# Patient Record
Sex: Female | Born: 1975 | Race: White | Hispanic: No | Marital: Married | Smoking: Never smoker
Health system: Southern US, Community
[De-identification: ages and names within clinical notes are randomized; demographics above are authoritative.]

## PROBLEM LIST (undated history)

## (undated) DIAGNOSIS — T7840XA Allergy, unspecified, initial encounter: Secondary | ICD-10-CM

## (undated) DIAGNOSIS — M26609 Unspecified temporomandibular joint disorder, unspecified side: Secondary | ICD-10-CM

## (undated) DIAGNOSIS — B019 Varicella without complication: Secondary | ICD-10-CM

## (undated) DIAGNOSIS — M1990 Unspecified osteoarthritis, unspecified site: Secondary | ICD-10-CM

## (undated) DIAGNOSIS — E079 Disorder of thyroid, unspecified: Secondary | ICD-10-CM

## (undated) DIAGNOSIS — N939 Abnormal uterine and vaginal bleeding, unspecified: Secondary | ICD-10-CM

## (undated) DIAGNOSIS — M778 Other enthesopathies, not elsewhere classified: Secondary | ICD-10-CM

## (undated) DIAGNOSIS — J45909 Unspecified asthma, uncomplicated: Secondary | ICD-10-CM

## (undated) DIAGNOSIS — N83209 Unspecified ovarian cyst, unspecified side: Secondary | ICD-10-CM

## (undated) DIAGNOSIS — J302 Other seasonal allergic rhinitis: Secondary | ICD-10-CM

## (undated) HISTORY — DX: Abnormal uterine and vaginal bleeding, unspecified: N93.9

## (undated) HISTORY — DX: Varicella without complication: B01.9

## (undated) HISTORY — DX: Disorder of thyroid, unspecified: E07.9

## (undated) HISTORY — PX: WISDOM TOOTH EXTRACTION: SHX21

## (undated) HISTORY — DX: Allergy, unspecified, initial encounter: T78.40XA

---

## 2005-09-27 ENCOUNTER — Other Ambulatory Visit: Admission: RE | Admit: 2005-09-27 | Discharge: 2005-09-27 | Payer: Self-pay | Admitting: Family Medicine

## 2006-10-01 ENCOUNTER — Other Ambulatory Visit: Admission: RE | Admit: 2006-10-01 | Discharge: 2006-10-01 | Payer: Self-pay | Admitting: Family Medicine

## 2007-10-05 ENCOUNTER — Other Ambulatory Visit: Admission: RE | Admit: 2007-10-05 | Discharge: 2007-10-05 | Payer: Self-pay | Admitting: Family Medicine

## 2008-10-10 ENCOUNTER — Emergency Department (HOSPITAL_BASED_OUTPATIENT_CLINIC_OR_DEPARTMENT_OTHER): Admission: EM | Admit: 2008-10-10 | Discharge: 2008-10-10 | Payer: Self-pay | Admitting: Emergency Medicine

## 2008-12-05 ENCOUNTER — Other Ambulatory Visit: Admission: RE | Admit: 2008-12-05 | Discharge: 2008-12-05 | Payer: Self-pay | Admitting: Family Medicine

## 2009-10-16 IMAGING — CT CT ABDOMEN W/ CM
2 of 4 series · 16 of 46 positions shown, 18 images · IV contrast (agent unspecified)
Comparison: None

CT ABDOMEN

CLINICAL DATA: Abdominal pain, particularly in right lower
quadrant

CT ABDOMEN AND PELVIS WITH CONTRAST
TECHNIQUE: Multidetector CT imaging of the abdomen and pelvis was
performed using the standard protocol following bolus
administration of intravenous contrast.
Contrast: 100 ml 2mnipaque-CVV

[Series 2: abd/pelvis 5.0 b31f · axial · 0.65mm/px · z∈[+810,+1204]mm · 13 of 87 slices shown, 15 images]
[im 4/87  soft-tissue]
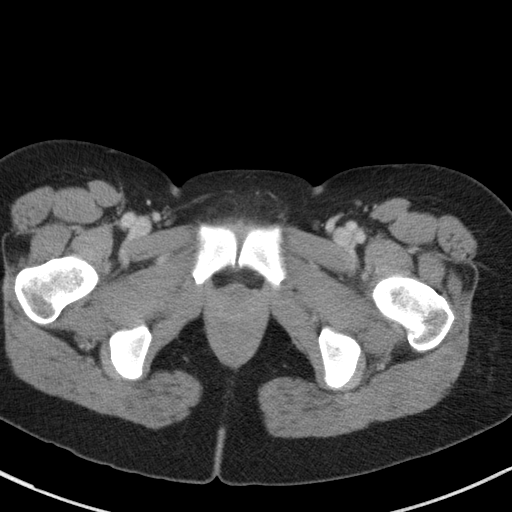
[im 4/87  bone]
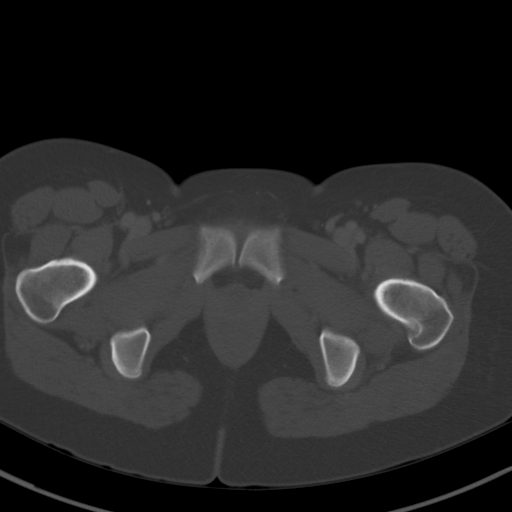
[im 12/87  soft-tissue]
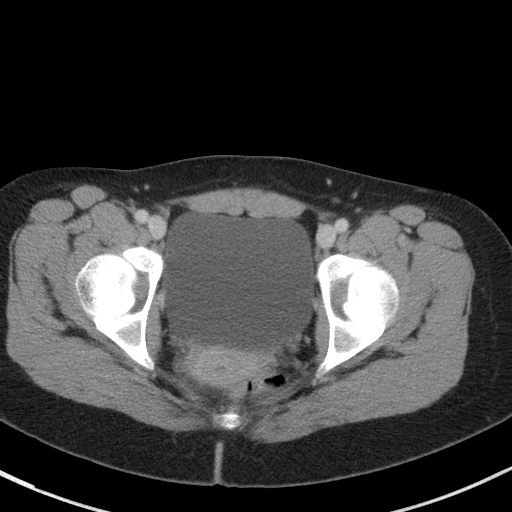
[im 19/87  soft-tissue]
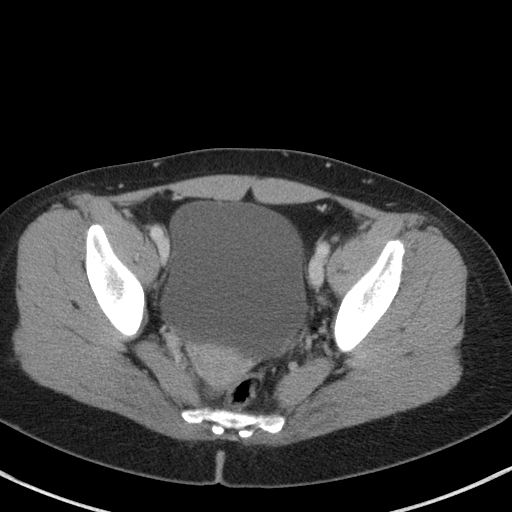
[im 23/87  soft-tissue]
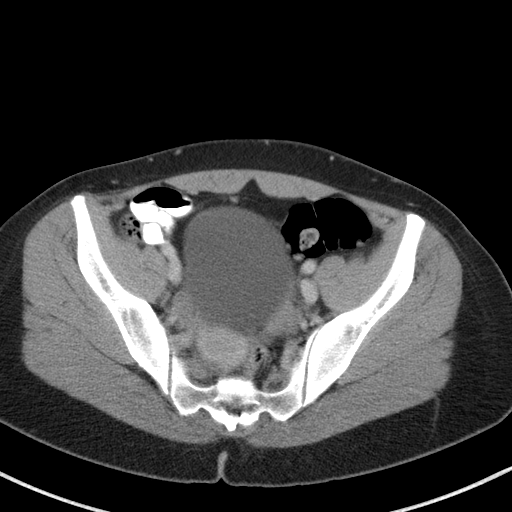
[im 30/87  soft-tissue]
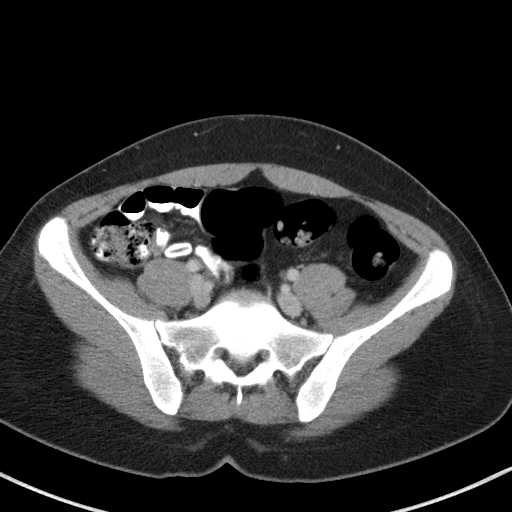
[im 38/87  soft-tissue]
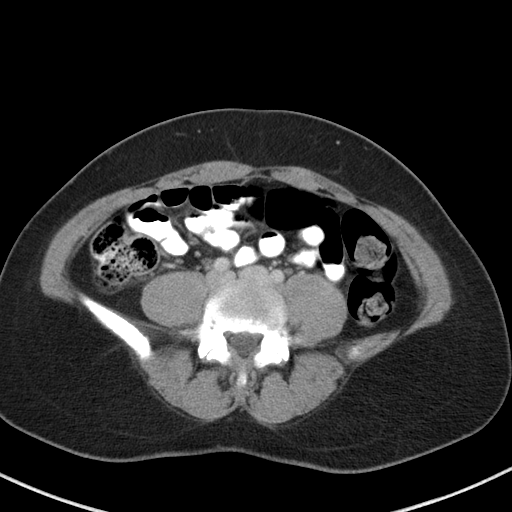
[im 45/87  soft-tissue]
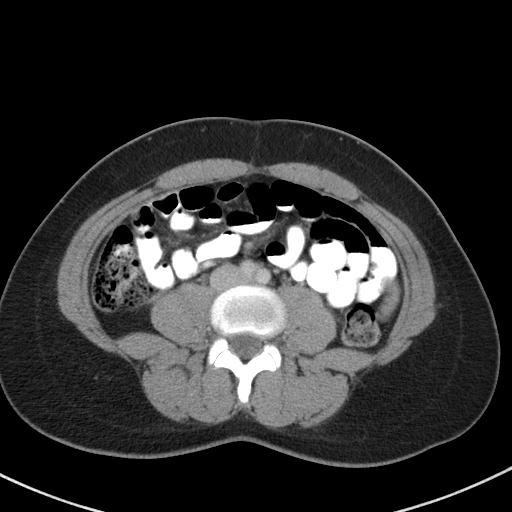
[im 49/87  soft-tissue]
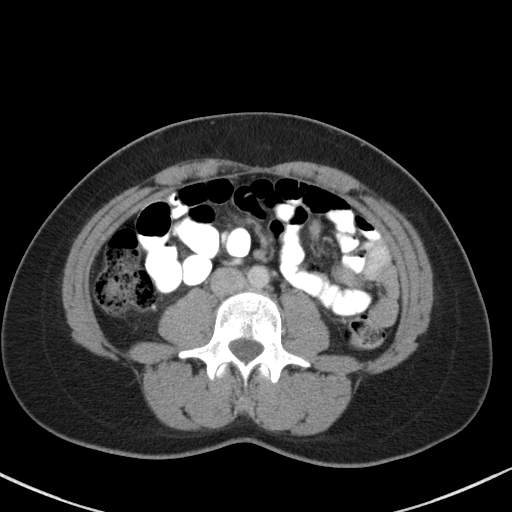
[im 57/87  soft-tissue]
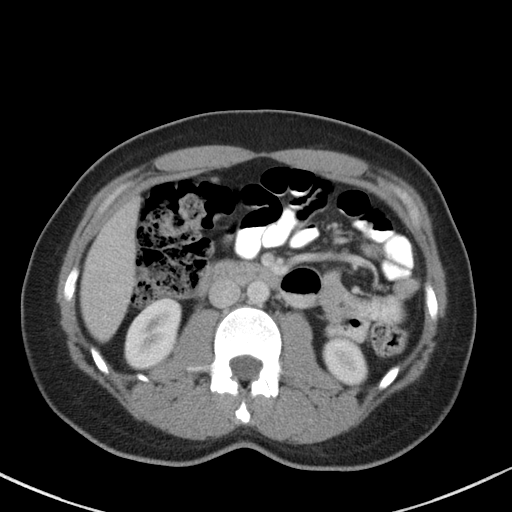
[im 57/87  bone]
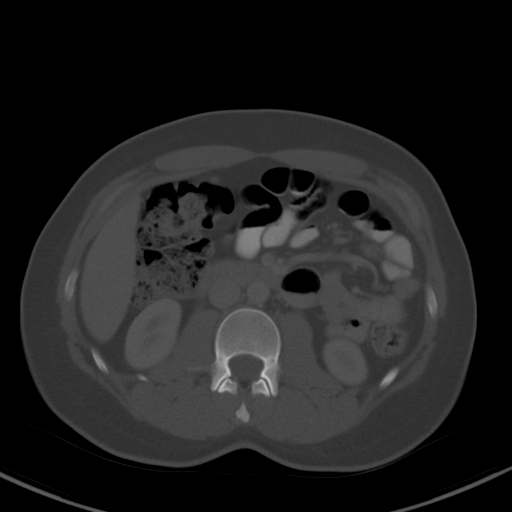
[im 64/87  soft-tissue]
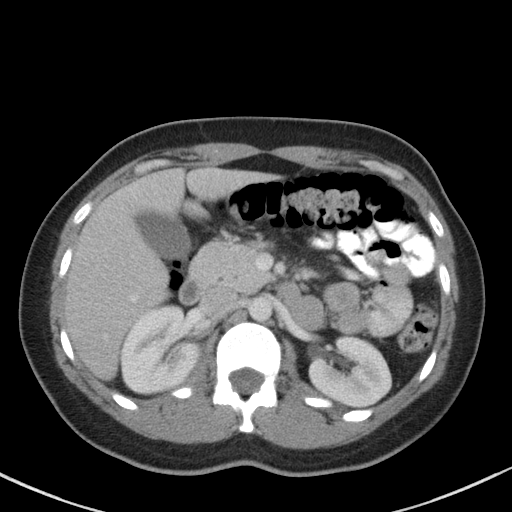
[im 68/87  soft-tissue]
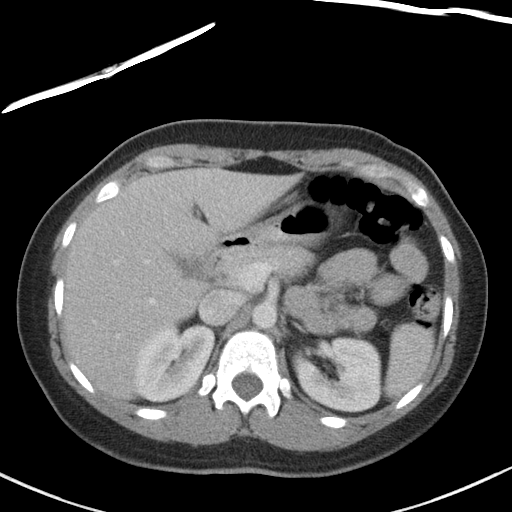
[im 75/87  soft-tissue]
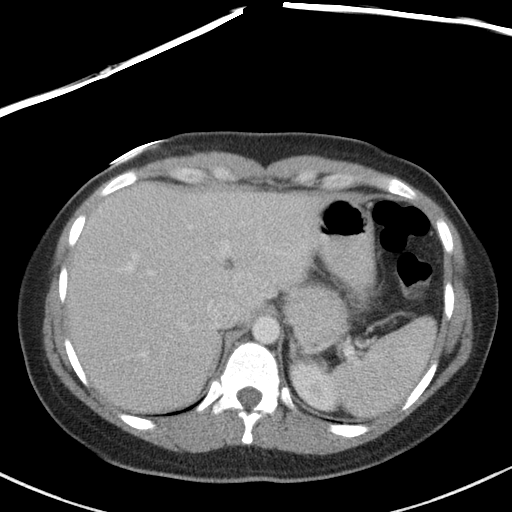
[im 83/87  soft-tissue]
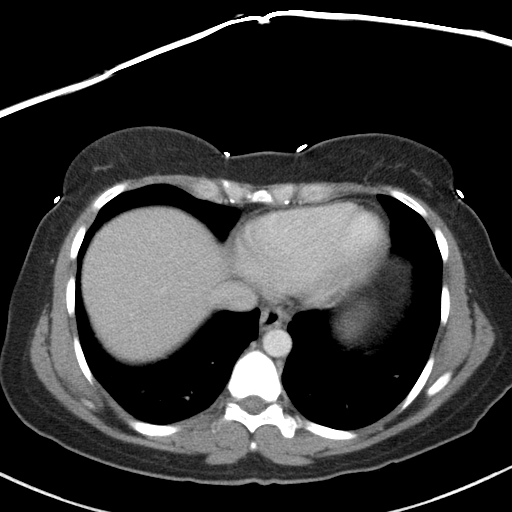

[Series 3: abd/pelvis 2.0 coronal · coronal · 0.66mm/px · 3 of 117 slices shown]
[im 39/117  soft-tissue]
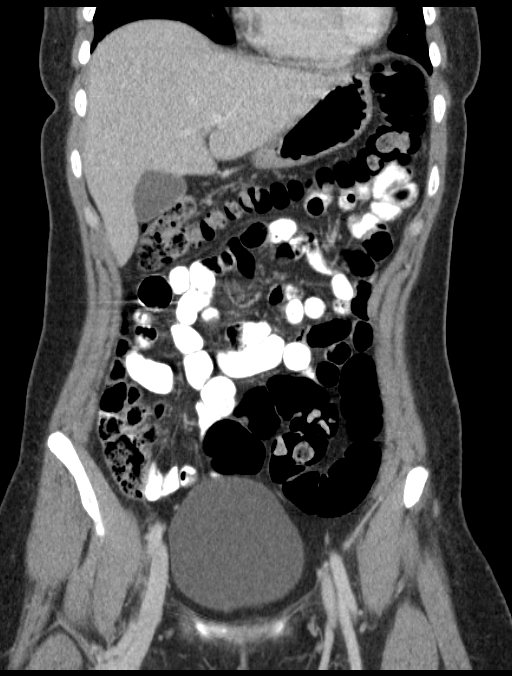
[im 52/117  soft-tissue]
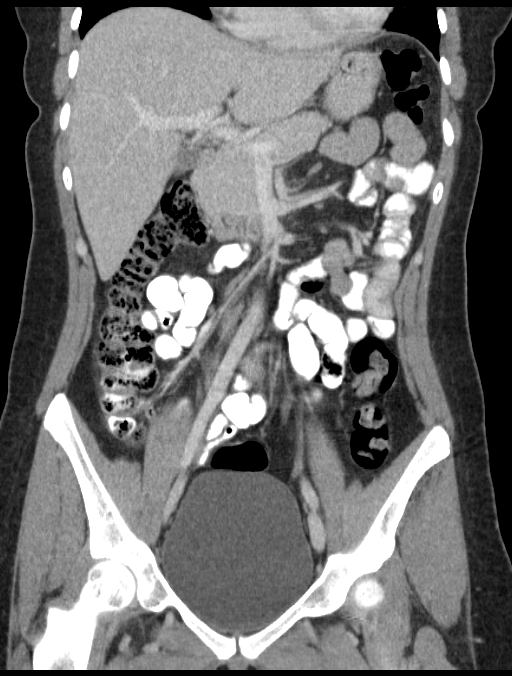
[im 65/117  soft-tissue]
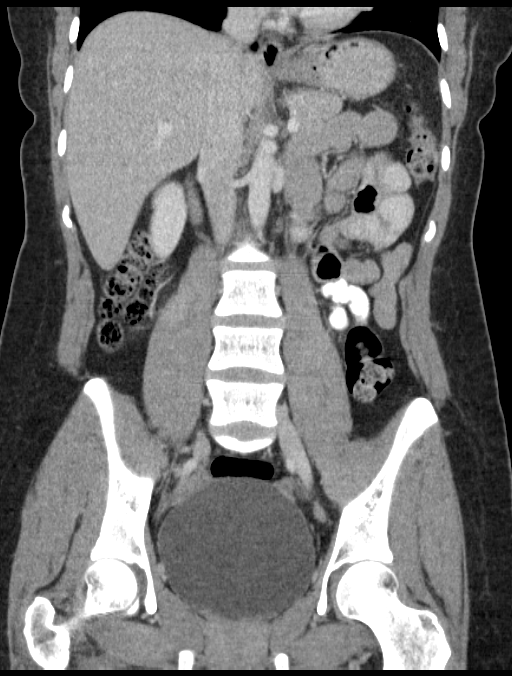

[16 of 46 positions shown; findings below may reference images not displayed]

FINDINGS: Lung bases are clear.  The liver enhances with no focal
abnormality and no ductal dilatation is seen.  No calcified
gallstones are noted.  The pancreas is normal in size and the
pancreatic duct is not dilated.  The adrenal glands and spleen
appear normal.  The kidneys enhance and on delayed images the
pelvocaliceal systems appear normal.  The abdominal aorta is normal
in caliber.  No adenopathy is seen.
IMPRESSION: Negative CT the abdomen.

CT PELVIS
FINDINGS: The urinary bladder is distended and unremarkable.  The
uterus is normal in size.  No adnexal lesion is seen and no free
fluid is seen within the pelvis.  The appendix and terminal ileum
are well seen and appear normal.  No bony abnormality is noted.
IMPRESSION: 1.  No significant abnormality other than a slightly distended
urinary bladder.
2.  Appendix and terminal ileum appear normal.

## 2011-08-20 LAB — URINE MICROSCOPIC-ADD ON

## 2011-08-20 LAB — DIFFERENTIAL
Basophils Relative: 0
Eosinophils Absolute: 0.1
Lymphocytes Relative: 17
Monocytes Absolute: 0.4
Monocytes Relative: 4
Neutrophils Relative %: 79 — ABNORMAL HIGH

## 2011-08-20 LAB — RPR: RPR Ser Ql: NONREACTIVE

## 2011-08-20 LAB — URINALYSIS, ROUTINE W REFLEX MICROSCOPIC: Hgb urine dipstick: NEGATIVE

## 2011-08-20 LAB — CBC
Hemoglobin: 13.1
MCV: 89.4
RDW: 11.7
WBC: 11.1 — ABNORMAL HIGH

## 2011-08-20 LAB — COMPREHENSIVE METABOLIC PANEL
ALT: 9
AST: 19
Alkaline Phosphatase: 81
CO2: 24
GFR calc Af Amer: >60
GFR calc non Af Amer: >60
Potassium: 3.4 — ABNORMAL LOW
Sodium: 139
Total Bilirubin: 0.4
Total Protein: 7.2

## 2011-08-20 LAB — URINE CULTURE: Culture: NO GROWTH

## 2011-08-20 LAB — LIPASE, BLOOD: Lipase: 92

## 2011-08-20 LAB — GC/CHLAMYDIA PROBE AMP, GENITAL: Chlamydia, DNA Probe: NEGATIVE

## 2011-11-19 NOTE — L&D Delivery Note (Signed)
Operative Delivery Note At 8:57 PM a viable and healthy female was delivered via Vaginal, Vacuum Investment banker, operational).  Presentation: vertex; Position: Left,, Occiput,, Anterior; Station: +4.  Verbal consent: obtained from patient.  Risks and benefits discussed in detail.  Risks include, but are not limited to the risks of anesthesia, bleeding, infection, damage to maternal tissues, fetal cephalhematoma.  There is also the risk of inability to effect vaginal delivery of the head, or shoulder dystocia that cannot be resolved by established maneuvers, leading to the need for emergency cesarean section.  APGAR: 9, 9; weight .   Placenta status: Intact, Spontaneous.   Cord: 3 vessels with the following complications: None.  Cord pH: na  Anesthesia: Epidural  Instruments: Kiwi Episiotomy: None Lacerations: 2nd degree;Perineal Suture Repair: 2.0 3.0 vicryl vicryl rapide Est. Blood Loss (mL): 500  Mom to postpartum.  Baby to nursery-stable.  Mataio Mele J 11/05/2012, 9:25 PM

## 2012-03-25 LAB — OB RESULTS CONSOLE ABO/RH: RH Type: POSITIVE

## 2012-03-25 LAB — OB RESULTS CONSOLE RUBELLA ANTIBODY, IGM: Rubella: IMMUNE

## 2012-03-25 LAB — OB RESULTS CONSOLE HIV ANTIBODY (ROUTINE TESTING): HIV: NONREACTIVE

## 2012-03-25 LAB — OB RESULTS CONSOLE HEPATITIS B SURFACE ANTIGEN: Hepatitis B Surface Ag: NEGATIVE

## 2012-03-25 LAB — OB RESULTS CONSOLE RPR: RPR: NONREACTIVE

## 2012-03-25 LAB — OB RESULTS CONSOLE ANTIBODY SCREEN: Antibody Screen: NEGATIVE

## 2012-03-31 LAB — OB RESULTS CONSOLE GC/CHLAMYDIA: Chlamydia: NEGATIVE

## 2012-09-23 LAB — OB RESULTS CONSOLE GBS: GBS: NEGATIVE

## 2012-11-05 ENCOUNTER — Encounter (HOSPITAL_COMMUNITY): Payer: Self-pay | Admitting: *Deleted

## 2012-11-05 ENCOUNTER — Inpatient Hospital Stay (HOSPITAL_COMMUNITY): Payer: No Typology Code available for payment source | Admitting: Anesthesiology

## 2012-11-05 ENCOUNTER — Encounter (HOSPITAL_COMMUNITY): Payer: Self-pay | Admitting: Anesthesiology

## 2012-11-05 ENCOUNTER — Inpatient Hospital Stay (HOSPITAL_COMMUNITY)
Admission: AD | Admit: 2012-11-05 | Discharge: 2012-11-07 | DRG: 775 | Disposition: A | Discharge status: Home / Self Care | Payer: No Typology Code available for payment source | Source: Ambulatory Visit | Attending: Obstetrics and Gynecology | Admitting: Obstetrics and Gynecology

## 2012-11-05 HISTORY — DX: Other seasonal allergic rhinitis: J30.2

## 2012-11-05 HISTORY — DX: Other enthesopathies, not elsewhere classified: M77.8

## 2012-11-05 HISTORY — DX: Unspecified osteoarthritis, unspecified site: M19.90

## 2012-11-05 HISTORY — DX: Unspecified temporomandibular joint disorder, unspecified side: M26.609

## 2012-11-05 HISTORY — DX: Unspecified asthma, uncomplicated: J45.909

## 2012-11-05 HISTORY — DX: Unspecified ovarian cyst, unspecified side: N83.209

## 2012-11-05 LAB — CBC
Platelets: 142 10*3/uL — ABNORMAL LOW (ref 150–400)
RDW: 12.7 % (ref 11.5–15.5)
WBC: 16.7 10*3/uL — ABNORMAL HIGH (ref 4.0–10.5)

## 2012-11-05 MED ORDER — FENTANYL 2.5 MCG/ML BUPIVACAINE 1/10 % EPIDURAL INFUSION (WH - ANES)
14.0000 mL/h | INTRAMUSCULAR | Status: DC
  Filled 2012-11-05: qty 125

## 2012-11-05 MED ORDER — OXYTOCIN 40 UNITS IN LACTATED RINGERS INFUSION - SIMPLE MED: 62.5000 mL/h | INTRAVENOUS | Status: DC

## 2012-11-05 MED ORDER — FLEET ENEMA 7-19 GM/118ML RE ENEM: 1.0000 | ENEMA | RECTAL | Status: DC | PRN

## 2012-11-05 MED ORDER — LACTATED RINGERS IV SOLN: 500.0000 mL | INTRAVENOUS | Status: DC | PRN

## 2012-11-05 MED ORDER — PHENYLEPHRINE 40 MCG/ML (10ML) SYRINGE FOR IV PUSH (FOR BLOOD PRESSURE SUPPORT)
80.0000 ug | PREFILLED_SYRINGE | INTRAVENOUS | Status: DC | PRN
  Filled 2012-11-05: qty 5

## 2012-11-05 MED ORDER — OXYTOCIN BOLUS FROM INFUSION
500.0000 mL | INTRAVENOUS | Status: DC
  Administered 2012-11-05: 500 mL via INTRAVENOUS

## 2012-11-05 MED ORDER — OXYCODONE-ACETAMINOPHEN 5-325 MG PO TABS: 1.0000 | ORAL_TABLET | ORAL | Status: DC | PRN

## 2012-11-05 MED ORDER — SENNOSIDES-DOCUSATE SODIUM 8.6-50 MG PO TABS
2.0000 | ORAL_TABLET | Freq: Every day | ORAL | Status: DC
  Administered 2012-11-06: 2 via ORAL

## 2012-11-05 MED ORDER — EPHEDRINE 5 MG/ML INJ: 10.0000 mg | INTRAVENOUS | Status: DC | PRN

## 2012-11-05 MED ORDER — LACTATED RINGERS IV SOLN
INTRAVENOUS | Status: DC
  Administered 2012-11-05 (×2): via INTRAVENOUS

## 2012-11-05 MED ORDER — LACTATED RINGERS IV SOLN: INTRAVENOUS | Status: DC

## 2012-11-05 MED ORDER — DIBUCAINE 1 % RE OINT: 1.0000 "application " | TOPICAL_OINTMENT | RECTAL | Status: DC | PRN

## 2012-11-05 MED ORDER — BUTORPHANOL TARTRATE 1 MG/ML IJ SOLN
1.0000 mg | INTRAMUSCULAR | Status: DC | PRN
  Administered 2012-11-05: 1 mg via INTRAVENOUS
  Filled 2012-11-05: qty 1

## 2012-11-05 MED ORDER — WITCH HAZEL-GLYCERIN EX PADS: 1.0000 "application " | MEDICATED_PAD | CUTANEOUS | Status: DC | PRN

## 2012-11-05 MED ORDER — LACTATED RINGERS IV SOLN
500.0000 mL | INTRAVENOUS | Status: DC | PRN
  Administered 2012-11-05: 1000 mL via INTRAVENOUS

## 2012-11-05 MED ORDER — DIPHENHYDRAMINE HCL 25 MG PO CAPS: 25.0000 mg | ORAL_CAPSULE | Freq: Four times a day (QID) | ORAL | Status: DC | PRN

## 2012-11-05 MED ORDER — ACETAMINOPHEN 325 MG PO TABS: 650.0000 mg | ORAL_TABLET | ORAL | Status: DC | PRN

## 2012-11-05 MED ORDER — METHYLERGONOVINE MALEATE 0.2 MG/ML IJ SOLN: 0.2000 mg | INTRAMUSCULAR | Status: DC | PRN

## 2012-11-05 MED ORDER — DIPHENHYDRAMINE HCL 50 MG/ML IJ SOLN: 12.5000 mg | INTRAMUSCULAR | Status: DC | PRN

## 2012-11-05 MED ORDER — METHYLERGONOVINE MALEATE 0.2 MG PO TABS: 0.2000 mg | ORAL_TABLET | ORAL | Status: DC | PRN

## 2012-11-05 MED ORDER — OXYTOCIN BOLUS FROM INFUSION: 500.0000 mL | INTRAVENOUS | Status: DC

## 2012-11-05 MED ORDER — LACTATED RINGERS IV SOLN: 500.0000 mL | Freq: Once | INTRAVENOUS | Status: DC

## 2012-11-05 MED ORDER — ONDANSETRON HCL 4 MG/2ML IJ SOLN: 4.0000 mg | Freq: Four times a day (QID) | INTRAMUSCULAR | Status: DC | PRN

## 2012-11-05 MED ORDER — IBUPROFEN 600 MG PO TABS: 600.0000 mg | ORAL_TABLET | Freq: Four times a day (QID) | ORAL | Status: DC | PRN

## 2012-11-05 MED ORDER — SIMETHICONE 80 MG PO CHEW: 80.0000 mg | CHEWABLE_TABLET | ORAL | Status: DC | PRN

## 2012-11-05 MED ORDER — ONDANSETRON HCL 4 MG PO TABS: 4.0000 mg | ORAL_TABLET | ORAL | Status: DC | PRN

## 2012-11-05 MED ORDER — CITRIC ACID-SODIUM CITRATE 334-500 MG/5ML PO SOLN: 30.0000 mL | ORAL | Status: DC | PRN

## 2012-11-05 MED ORDER — LIDOCAINE HCL (PF) 1 % IJ SOLN: 30.0000 mL | INTRAMUSCULAR | Status: DC | PRN

## 2012-11-05 MED ORDER — BENZOCAINE-MENTHOL 20-0.5 % EX AERO: 1.0000 "application " | INHALATION_SPRAY | CUTANEOUS | Status: DC | PRN

## 2012-11-05 MED ORDER — IBUPROFEN 600 MG PO TABS
600.0000 mg | ORAL_TABLET | Freq: Four times a day (QID) | ORAL | Status: DC
  Administered 2012-11-06 – 2012-11-07 (×7): 600 mg via ORAL
  Filled 2012-11-05 (×7): qty 1

## 2012-11-05 MED ORDER — PRENATAL MULTIVITAMIN CH
1.0000 | ORAL_TABLET | Freq: Every day | ORAL | Status: DC
  Administered 2012-11-06 – 2012-11-07 (×2): 1 via ORAL
  Filled 2012-11-05 (×2): qty 1

## 2012-11-05 MED ORDER — TETANUS-DIPHTH-ACELL PERTUSSIS 5-2.5-18.5 LF-MCG/0.5 IM SUSP: 0.5000 mL | Freq: Once | INTRAMUSCULAR | Status: DC

## 2012-11-05 MED ORDER — LIDOCAINE HCL (PF) 1 % IJ SOLN
INTRAMUSCULAR | Status: DC | PRN
  Administered 2012-11-05 (×2): 8 mL

## 2012-11-05 MED ORDER — LIDOCAINE HCL (PF) 1 % IJ SOLN
30.0000 mL | INTRAMUSCULAR | Status: DC | PRN
  Filled 2012-11-05: qty 30

## 2012-11-05 MED ORDER — PHENYLEPHRINE 40 MCG/ML (10ML) SYRINGE FOR IV PUSH (FOR BLOOD PRESSURE SUPPORT): 80.0000 ug | PREFILLED_SYRINGE | INTRAVENOUS | Status: DC | PRN

## 2012-11-05 MED ORDER — EPHEDRINE 5 MG/ML INJ
10.0000 mg | INTRAVENOUS | Status: DC | PRN
  Filled 2012-11-05: qty 4

## 2012-11-05 MED ORDER — OXYTOCIN 40 UNITS IN LACTATED RINGERS INFUSION - SIMPLE MED
62.5000 mL/h | INTRAVENOUS | Status: DC
  Filled 2012-11-05: qty 1000

## 2012-11-05 MED ORDER — ONDANSETRON HCL 4 MG/2ML IJ SOLN: 4.0000 mg | INTRAMUSCULAR | Status: DC | PRN

## 2012-11-05 MED ORDER — ZOLPIDEM TARTRATE 5 MG PO TABS: 5.0000 mg | ORAL_TABLET | Freq: Every evening | ORAL | Status: DC | PRN

## 2012-11-05 MED ORDER — FENTANYL 2.5 MCG/ML BUPIVACAINE 1/10 % EPIDURAL INFUSION (WH - ANES)
INTRAMUSCULAR | Status: DC | PRN
  Administered 2012-11-05: 14 mL/h via EPIDURAL

## 2012-11-05 MED ORDER — LANOLIN HYDROUS EX OINT: TOPICAL_OINTMENT | CUTANEOUS | Status: DC | PRN

## 2012-11-05 NOTE — MAU Note (Signed)
C/o ?SROM @ 0100 and contracting ever since then;

## 2012-11-05 NOTE — Progress Notes (Signed)
Dr Billy Coast called for update, informed of pushing,will be in shortly

## 2012-11-05 NOTE — Progress Notes (Signed)
Mariah Fields is a 36 y.o. G1P0 at [redacted]w[redacted]d by LMP admitted for active labor  Subjective: uncomfortable  Objective: BP 138/83  Pulse 78  Temp 98 F (36.7 C) (Oral)  Resp 18  Ht 5' 5.5" (1.664 m)  Wt 81.647 kg (180 lb)  BMI 29.50 kg/m2      FHT:  FHR: 155 bpm, variability: moderate,  accelerations:  Present,  decelerations:  Absent UC:   irregular, every 5 minutes SVE:   Dilation: 4.5 Effacement (%): 100 Station: 0 Exam by:: Dr Billy Coast AROM- clear  Labs: Lab Results  Component Value Date   WBC 16.7* 11/05/2012   HGB 14.2 11/05/2012   HCT 40.4 11/05/2012   MCV 88.8 11/05/2012   PLT 142* 11/05/2012    Assessment / Plan: Spontaneous labor, progressing normally  Labor: Progressing normally Preeclampsia:  na Fetal Wellbeing:  Category I Pain Control:  Labor support without medications I/D:  n/a Anticipated MOD:  NSVD  Delray Reza J 11/05/2012, 2:07 PM

## 2012-11-05 NOTE — Anesthesia Preprocedure Evaluation (Signed)
Anesthesia Evaluation  Patient identified by MRN, date of birth, ID band  Reviewed: Allergy & Precautions, H&P , NPO status , Patient's Chart, lab work & pertinent test results, reviewed documented beta blocker date and time   Airway Mallampati: I TM Distance: >3 FB Neck ROM: full    Dental  (+) Teeth Intact   Pulmonary    Pulmonary exam normal       Cardiovascular negative cardio ROS      Neuro/Psych negative psych ROS   GI/Hepatic negative GI ROS, Neg liver ROS,   Endo/Other  negative endocrine ROS  Renal/GU negative Renal ROS  negative genitourinary   Musculoskeletal negative musculoskeletal ROS (+)   Abdominal Normal abdominal exam  (+)   Peds negative pediatric ROS (+)  Hematology negative hematology ROS (+)   Anesthesia Other Findings   Reproductive/Obstetrics negative OB ROS                           Anesthesia Physical Anesthesia Plan  ASA: II  Anesthesia Plan: Epidural   Post-op Pain Management:    Induction:   Airway Management Planned:   Additional Equipment:   Intra-op Plan:   Post-operative Plan:   Informed Consent: I have reviewed the patients History and Physical, chart, labs and discussed the procedure including the risks, benefits and alternatives for the proposed anesthesia with the patient or authorized representative who has indicated his/her understanding and acceptance.   Dental Advisory Given and History available from chart only  Plan Discussed with: CRNA  Anesthesia Plan Comments:         Anesthesia Quick Evaluation

## 2012-11-05 NOTE — Anesthesia Procedure Notes (Signed)
Epidural Patient location during procedure: OB Start time: 11/05/2012 2:56 PM End time: 11/05/2012 3:00 PM  Staffing Anesthesiologist: Sandrea Hughs Performed by: anesthesiologist   Preanesthetic Checklist Completed: patient identified, site marked, surgical consent, pre-op evaluation, timeout performed, IV checked, risks and benefits discussed and monitors and equipment checked  Epidural Patient position: sitting Prep: site prepped and draped and DuraPrep Patient monitoring: continuous pulse ox and blood pressure Approach: midline Injection technique: LOR air  Needle:  Needle type: Tuohy  Needle gauge: 17 G Needle length: 9 cm and 9 Needle insertion depth: 5 cm cm Catheter type: closed end flexible Catheter size: 19 Gauge Catheter at skin depth: 10 cm Test dose: negative and Other  Assessment Sensory level: T8 Events: blood not aspirated, injection not painful, no injection resistance, negative IV test and no paresthesia  Additional Notes Reason for block:procedure for pain

## 2012-11-05 NOTE — H&P (Signed)
Mariah Fields, Mariah Fields              ACCOUNT NO.:  [id number]  MEDICAL RECORD NO.:  [id number]  LOCATION:  [facility]                          FACILITY:  [facility]  PHYSICIAN:  Lenoard Aden, M.D.DATE OF BIRTH:  Jan 22, 1976  DATE OF ADMISSION:  11/05/2012 DATE OF DISCHARGE:                             HISTORY & PHYSICAL   CHIEF COMPLAINT:  Labor.  HISTORY OF PRESENT ILLNESS:  She is a 36 year old white female, G1, P0, at 40 weeks 6/7th gestation who presents in active labor.   She has no known drug allergies.    Medications are prenatal vitamins, Ventolin, Benadryl as needed, Zyrtec as needed.  She is a nonsmoker, nondrinker.  She denies domestic or physical violence.  Her prenatal course has been uncomplicated to date.  FAMILY HISTORY:  Rheumatoid arthritis, stroke.  This is her first pregnancy.  She has a noncontributory surgical history.  PHYSICAL EXAMINATION:  GENERAL:  She is a well-developed, well- nourished, white female, in no acute distress.  HEENT:  Normal. NECK:  Supple.  Full range of motion. LUNGS:  Clear. HEART:  Regular rhythm. ABDOMEN:  Soft, gravid, nontender. PELVIC:  Estimated fetal weight 7 pounds.  Cervix is 3-4 cm, 100% vertex, +1.  EXTREMITIES:  There are no cords. NEUROLOGIC:  Nonfocal. SKIN:  Intact.  NST is reactive.  IMPRESSION:  Term intrauterine pregnancy in active labor.  PLAN:  Admit. epidural as needed.     Lenoard Aden, M.D.     RJT/MEDQ  D:  11/05/2012  T:  11/05/2012  Job:  [id number]

## 2012-11-05 NOTE — Progress Notes (Signed)
Dr Billy Coast at bedside discussing risks and benefits of vacuum assisted delivery, pt verbalizes and understands, to proceed with vacuum delivery, see dr delivery note.

## 2012-11-06 LAB — CBC
HCT: 32.6 % — ABNORMAL LOW (ref 36.0–46.0)
MCH: 30.8 pg (ref 26.0–34.0)
MCV: 90.6 fL (ref 78.0–100.0)
RDW: 12.8 % (ref 11.5–15.5)
WBC: 16.3 10*3/uL — ABNORMAL HIGH (ref 4.0–10.5)

## 2012-11-06 NOTE — Anesthesia Postprocedure Evaluation (Signed)
  Anesthesia Post-op Note  Patient: Mariah Fields  Procedure(s) Performed: * No procedures listed *  Patient Location: Mother/Baby  Anesthesia Type:Epidural  Level of Consciousness: awake, alert  and oriented  Airway and Oxygen Therapy: Patient Spontanous Breathing  Post-op Pain: mild  Post-op Assessment: Patient's Cardiovascular Status Stable, Respiratory Function Stable, Patent Airway, No signs of Nausea or vomiting and Pain level controlled  Post-op Vital Signs: Reviewed and stable  Complications: No apparent anesthesia complications

## 2012-11-06 NOTE — Progress Notes (Signed)
Post Partum Day VAVD without complications viable female infant. Subjective: no complaints, up ad lib without syncope, voiding, tolerating PO, + flatus, +BM  Pain well controlled with po meds, taking motrin and percocet  BF: on demand - needs lactation consult to assist lacting Mood stable, bonding well   Objective: Blood pressure 119/73, pulse 99, temperature 99 F (37.2 C), temperature source Oral, resp. rate 20, height 5' 5.5" (1.664 m), weight 81.647 kg (180 lb), SpO2 97.00%, unknown if currently breastfeeding.  Physical Exam:  General: alert, cooperative and no distress Breast: N/T Lungs: CTAB Heart: RRR Lochia: appropriate, -2/u Uterine Fundus: firm Perineum: Slight swelling but healing well - ice pack remain in place. DVT Evaluation: No evidence of DVT seen on physical exam. Negative Homan's sign. No cords or calf tenderness. No significant calf/ankle edema.   Basename 11/06/12 0533 11/05/12 1127  HGB 11.1* 14.2  HCT 32.6* 40.4    Assessment/Plan: Plan for discharge tomorrow        LOS: 1 day   Tericka Devincenzi 11/06/2012, 10:57 AM

## 2012-11-07 MED ORDER — IBUPROFEN 600 MG PO TABS: 600.0000 mg | ORAL_TABLET | Freq: Four times a day (QID) | ORAL | Status: AC

## 2012-11-07 NOTE — Progress Notes (Signed)
PPD 2 SVD  S:  Reports feeling well - ready to go home             Tolerating po/ No nausea or vomiting             Bleeding is light             Pain controlled with motrin and percocet             Up ad lib / ambulatory  Newborn breast feeding     O:               VS: BP 129/77  Pulse 89  Temp 98.3 F (36.8 C) (Oral)  Resp 18  Ht 5' 5.5" (1.664 m)  Wt 81.647 kg (180 lb)  BMI 29.50 kg/m2  SpO2 96%  Breastfeeding? Unknown   LABS:  Basename 11/06/12 0533 11/05/12 1127  WBC 16.3* 16.7*  HGB 11.1* 14.2  PLT 137* 142*                                          I&O:                         I/O last 3 completed shifts: In: -  Out: 500 [Blood:500]               Physical Exam:             Alert and oriented X3  Abdomen: soft, non-tender, non-distended              Fundus: firm, non-tender, U-1  Perineum: mild edema  Lochia: light  Extremities: trace edema, no calf pain or tenderness    A: PPD # 2   Doing well - stable status  P:  Routine post partum orders  Discharge to home  Marlinda Mike CNM, MSN 11/07/2012, 11:04 AM

## 2012-11-07 NOTE — Discharge Summary (Signed)
Obstetric Discharge Summary  Reason for Admission: onset of labor Prenatal Procedures: none Intrapartum Procedures: vacuum Postpartum Procedures: none Complications-Operative and Postpartum: 2nd degree perineal laceration Hemoglobin  Date Value Range Status  11/06/2012 11.1* 12.0 - 15.0 g/dL Final     DELTA CHECK NOTED     REPEATED TO VERIFY     HCT  Date Value Range Status  11/06/2012 32.6* 36.0 - 46.0 % Final    Physical Exam:  General: alert, cooperative and no distress Lochia: appropriate Uterine Fundus: firm Incision: healing well DVT Evaluation: No evidence of DVT seen on physical exam.  Discharge Diagnoses: Term Pregnancy-delivered  Discharge Information: Date: 11/07/2012 Activity: pelvic rest Diet: routine Medications: PNV and Ibuprofen Condition: stable Instructions: refer to practice specific booklet Discharge to: home Follow-up Information    Follow up with Lenoard Aden, MD. Schedule an appointment as soon as possible for a visit in 6 weeks.   Contact information:   [address] [place] [address] [phone]          Newborn Data: Live born female  Birth Weight: 7 lb 8.8 oz (3425 g) APGAR: 9, 9  Home with mother.  Marlinda Mike 11/07/2012, 11:07 AM

## 2014-09-19 ENCOUNTER — Encounter (HOSPITAL_COMMUNITY): Payer: Self-pay | Admitting: *Deleted

## 2017-12-05 DIAGNOSIS — Z01419 Encounter for gynecological examination (general) (routine) without abnormal findings: Secondary | ICD-10-CM | POA: Diagnosis not present

## 2017-12-05 DIAGNOSIS — Z6824 Body mass index (BMI) 24.0-24.9, adult: Secondary | ICD-10-CM | POA: Diagnosis not present

## 2018-01-19 DIAGNOSIS — N939 Abnormal uterine and vaginal bleeding, unspecified: Secondary | ICD-10-CM | POA: Diagnosis not present

## 2018-07-25 DIAGNOSIS — Z23 Encounter for immunization: Secondary | ICD-10-CM | POA: Diagnosis not present

## 2018-07-28 LAB — HM PAP SMEAR: HM Pap smear: NEGATIVE

## 2018-08-12 DIAGNOSIS — E039 Hypothyroidism, unspecified: Secondary | ICD-10-CM | POA: Diagnosis not present

## 2018-08-12 DIAGNOSIS — Z13220 Encounter for screening for lipoid disorders: Secondary | ICD-10-CM | POA: Diagnosis not present

## 2018-08-12 LAB — LIPID PANEL
CHOLESTEROL: 189 (ref 0–200)
HDL: 90 — AB (ref 35–70)
LDL CALC: 92
Triglycerides: 33 — AB (ref 40–160)

## 2018-08-12 LAB — TSH: TSH: 1.12 (ref <=5.90)

## 2018-09-28 DIAGNOSIS — Z1231 Encounter for screening mammogram for malignant neoplasm of breast: Secondary | ICD-10-CM | POA: Diagnosis not present

## 2018-10-09 ENCOUNTER — Encounter: Payer: Self-pay | Admitting: Family Medicine

## 2018-10-09 ENCOUNTER — Ambulatory Visit (HOSPITAL_BASED_OUTPATIENT_CLINIC_OR_DEPARTMENT_OTHER)
Admission: RE | Admit: 2018-10-09 | Discharge: 2018-10-09 | Disposition: A | Discharge status: Home / Self Care | Payer: 59 | Source: Ambulatory Visit | Attending: Family Medicine | Admitting: Family Medicine

## 2018-10-09 ENCOUNTER — Ambulatory Visit (INDEPENDENT_AMBULATORY_CARE_PROVIDER_SITE_OTHER): Payer: 59 | Admitting: Family Medicine

## 2018-10-09 VITALS — BP 112/80 | HR 100 | Temp 98.0°F | Resp 20 | Ht 65.0 in | Wt 149.0 lb

## 2018-10-09 DIAGNOSIS — S99912A Unspecified injury of left ankle, initial encounter: Secondary | ICD-10-CM | POA: Insufficient documentation

## 2018-10-09 DIAGNOSIS — M25572 Pain in left ankle and joints of left foot: Secondary | ICD-10-CM | POA: Diagnosis not present

## 2018-10-09 MED ORDER — NAPROXEN 500 MG PO TABS: 500.0000 mg | ORAL_TABLET | Freq: Two times a day (BID) | ORAL | 0 refills | Status: AC

## 2018-10-09 NOTE — Progress Notes (Signed)
Patient ID: Mariah Fields, female  DOB: 09-11-1976, 42 y.o.   MRN: [id number] Patient Care Team    Relationship Specialty Notifications Start End  Natalia LeatherwoodKuneff,  A, DO PCP - General Family Medicine  10/09/18   Olivia Mackieaavon, Richard, MD Consulting Physician Obstetrics and Gynecology  10/09/18     Chief Complaint  Patient presents with  . Establish Care  . Ankle Pain    injury 8 weeks ago    Subjective:  Mariah Fields is a 42 y.o.  female present for new patient establishment. All past medical history, surgical history, allergies, family history, immunizations, medications and social history were updated in the electronic medical record today. All recent labs, ED visits and hospitalizations within the last year were reviewed.  Left ankle injury:  Pt reports she injured her ankle roller blading about 8 weeks ago. She thinks she rolled her ankle when bending over to tie her laces while moving. The ankle initially had swollen, but has returned to normal. She has been icing, using heat, wearing a brace (intermitenlty). She is very active - training for a 5K and mudder like event with obstacles is coming up and she will need to train for that as well. She reports she has pain in the front of the ankle, lateral ankle and about 3 inches proximal from lateral ankle.  She does not recall injuring her ankle in the past. She has had AS since she was in her 8520's.   Depression screen [facility] 10/09/2018  Decreased Interest 0  Down, Depressed, Hopeless 0  PHQ - 2 Score 0   No flowsheet data found.  Current Exercise Habits: Home exercise routine, Type of exercise: calisthenics;strength training/weights, Time (Minutes): 60, Frequency (Times/Week): 6, Weekly Exercise (Minutes/Week): 360, Intensity: Moderate   No flowsheet data found.   Immunization History  Administered Date(s) Administered  . Influenza Inj Mdck Quad Pf 07/25/2018  . Influenza, Seasonal, Injecte, Preservative Fre 08/06/2014  .  Tdap 10/05/2007    No exam data present  Past Medical History:  Diagnosis Date  . Allergy   . Arthritis   . Asthma   . Chicken pox   . Ovarian cyst   . Seasonal allergies   . Tendonitis of wrist, right   . Thyroid disease   . TMJ dysfunction    Allergies  Allergen Reactions  . Benzoyl Peroxide   . Other Rash    Benzol Peroxide   Past Surgical History:  Procedure Laterality Date  . WISDOM TOOTH EXTRACTION     Family History  Problem Relation Age of Onset  . Arthritis Mother   . Hyperlipidemia Mother   . Hyperlipidemia Brother   . Hypertension Brother   . Arthritis Paternal Grandmother   . Miscarriages / Stillbirths Paternal Grandmother   . Stroke Paternal Grandmother   . Diabetes Paternal Grandfather   . Early death Paternal Grandfather   . Other Neg Hx    Social History   Socioeconomic History  . Marital status: Married    Spouse name: Not on file  . Number of children: Not on file  . Years of education: Not on file  . Highest education level: Not on file  Occupational History  . Not on file  Social Needs  . Financial resource strain: Not on file  . Food insecurity:    Worry: Not on file    Inability: Not on file  . Transportation needs:    Medical: Not on file    Non-medical: Not  on file  Tobacco Use  . Smoking status: Never Smoker  . Smokeless tobacco: Never Used  Substance and Sexual Activity  . Alcohol use: Yes  . Drug use: No  . Sexual activity: Yes  Lifestyle  . Physical activity:    Days per week: Not on file    Minutes per session: Not on file  . Stress: Not on file  Relationships  . Social connections:    Talks on phone: Not on file    Gets together: Not on file    Attends religious service: Not on file    Active member of club or organization: Not on file    Attends meetings of clubs or organizations: Not on file    Relationship status: Not on file  . Intimate partner violence:    Fear of current or ex partner: Not on file     Emotionally abused: Not on file    Physically abused: Not on file    Forced sexual activity: Not on file  Other Topics Concern  . Not on file  Social History Narrative  . Not on file   Allergies as of 10/09/2018      Reactions   Benzoyl Peroxide    Other Rash   Benzol Peroxide      Medication List        Accurate as of 10/09/18  4:34 PM. Always use your most recent med list.          albuterol 108 (90 Base) MCG/ACT inhaler Commonly known as:  PROVENTIL HFA;VENTOLIN HFA Inhale 2 puffs into the lungs every 6 (six) hours as needed.   CALCIUM 600+D 600-800 MG-UNIT Tabs Generic drug:  Calcium Carb-Cholecalciferol Take 1 tablet by mouth daily.   cetirizine 10 MG tablet Commonly known as:  ZYRTEC Take 10 mg by mouth daily.   ibuprofen 600 MG tablet Commonly known as:  ADVIL,MOTRIN Take 1 tablet (600 mg total) by mouth every 6 (six) hours.   levonorgestrel 20 MCG/24HR IUD Commonly known as:  MIRENA by Intrauterine route.   levothyroxine 75 MCG tablet Commonly known as:  SYNTHROID, LEVOTHROID Take 1 tablet by mouth daily.   Magnesium 100 MG Caps Take by mouth.   montelukast 10 MG tablet Commonly known as:  SINGULAIR   multivitamin capsule Take 1 capsule by mouth daily.   naproxen 500 MG tablet Commonly known as:  NAPROSYN Take 1 tablet (500 mg total) by mouth 2 (two) times daily with a meal.   SUPER B COMPLEX/C PO Take by mouth.   Zinc 50 MG Caps Take by mouth.       All past medical history, surgical history, allergies, family history, immunizations andmedications were updated in the EMR today and reviewed under the history and medication portions of their EMR.    No results found for this or any previous visit (from the past 2160 hour(s)).  No results found.   ROS: 14 pt review of systems performed and negative (unless mentioned in an HPI)  Objective: BP 112/80 (BP Location: Right Arm, Patient Position: Sitting, Cuff Size: Normal)   Pulse 100    Temp 98 F (36.7 C)   Resp 20   Ht 5\' 5"  (1.651 m)   Wt 149 lb (67.6 kg)   SpO2 98%   BMI 24.79 kg/m  Gen: Afebrile. No acute distress. Nontoxic in appearance, well-developed, well-nourished,  Pleasant caucasian female.  HENT: AT. [place]. MMM Eyes:Pupils Equal Round Reactive to light, Extraocular movements intact,  Conjunctiva without redness,  discharge or icterus. Neuro/Msk:  Normal gait. PERLA. EOMi. Alert. Oriented x3.     Left ankle: no bruising,  no erythema, no soft tissue swelling. TTP anterior talus and  Proximal to lateral epicondyle. Discomfort with inversion and talar/ankle drawer test- which did not show ligament laxity, but did cause pain. Negative squeeze test. NV intact distally.  Psych: Normal affect, dress and demeanor. Normal speech. Normal thought content and judgment.  Assessment/plan: Mariah Fields is a 42 y.o. female present for est.  Injury of left ankle, initial encounter - concern for avulsion injury since pain > 8 weeks. DDX high sprain without adequate rest and treatment. She is a very active female and would benefit from sports med opinion if xray is normal.  - naproxen BID for 7 days.  - more supportive ankle brace applied today for pt to wear during all waking hours  - rest- no training at least 2 weeks or until sports med clears.  - DG Ankle Complete Left; Future - F/u 2-4 weeks if not est w/ sports med ([place])    Return in about 4 weeks (around 11/06/2018), or if symptoms worsen or fail to improve.   Note is dictated utilizing voice recognition software. Although note has been proof read prior to signing, occasional typographical errors still can be missed. If any questions arise, please do not hesitate to call for verification.  Electronically signed by: Felix Pacini, DO [facility] [place]

## 2018-10-09 NOTE — Patient Instructions (Addendum)
It was very nice to meet you today.  Please have xray completed at [facility]. We will call you with results. We want rule out an avulsion injury.  Wear the brace during all waking hours for 2 weeks. Avoid all physical activity such as running, skating etc. That would place strain on that ankle.   Take naproxen prescribed every 12 hours for 7 days, with food.   I [place] refer [place] to Sports med [place].   Follow here if worsening or not able to see sports med within 4 weeks.    Avulsion Fracture of the Foot An avulsion fracture of the foot is when a piece of bone in your foot has been torn away. Bones are connected to other bones by strong bands of connective tissue (ligaments). Muscles are also connected to bones with connective tissue (tendons). Avulsion fractures occur when severe stress on a bone from a ligament or tendon causes a small piece of bone to be pulled away. Athletes may develop an avulsion fracture of the foot gradually (chronic avulsion fracture). The heel bone and the long bone in the foot that connect to the fifth toe (fifth metatarsal bone) are common areas of avulsion fracture of the foot. What are the causes? An avulsion fracture of the foot can be caused by a sudden or repetitive twisting of your foot or ankle. It can also occur during a fall from a standing height. What increases the risk? You may have a higher risk of an avulsion fracture of the foot if you:  Participate in activities during which twisting the ankle or foot are likely, such as: ? Dancing. ? Track and field. ? Walking or hiking on uneven surfaces.  Have had diabetes for many years.  Have osteoporosis.  What are the signs or symptoms? The most common symptom of an avulsion fracture of the foot is intense pain at the time of injury. You may also feel a pop or tearing. The pain continues after the injury. Other signs and symptoms may  include:  Swelling.  Bruising.  Pain with movement or weight bearing.  Difficulty walking.  Pain when pressure is applied to the injured area.  Warmth over the injured area.  How is this diagnosed? An avulsion fracture of the foot may be diagnosed by:  History. Your health care provider will ask you what occurred during the time of your injury and whether you had any pain in the area before your injury.  Physical exam. During the exam, your health care provider may try to move your foot, toes, and ankle to check for pain and level of mobility.  X-ray. This will show if any bones are fractured or out of place.  MRI. This will show your tendons and ligaments. Some avulsion fractures are associated with an injury to a tendon or ligament.  How is this treated? Treatment for an avulsion fracture of the foot depends on the size of the displaced piece of bone and how far it has been pulled out of place. Small avulsion fractures may be treated with rest and support in a cast or brace. Large fragments of bone usually need to be reattached surgically. Treatment of these fractures may also require physical therapy to regain full use of your foot. Treatments may include:  Rest, ice, compression, and elevations (RICE treatment) as directed by your health care provider.  Medicines that reduce pain and swelling (NSAIDs).  Wearing a splint, elastic wrap, support boot, or  cast as directed by your health care provider.  Crutches or a rolling scooter to support your body weight until your foot heals.  Surgery to reattach the bone and tendon or ligament.  Physical therapy. This may last for several months.  Follow these instructions at home:  Take medicines only as directed by your health care provider.  Rest your foot until your health care provider says you can resume activity.  Keep your foot raised above the level of your heart when you are sitting or lying down.  Apply ice to the  injured area: ? Put ice in a plastic bag. ? Place a towel between your skin and the bag. ? Leave the ice on for 20 minutes, 2-3 times a day.  Do not allow your cast or splint to get wet as directed by your health care provider.  Keep all follow-up visits as directed by your health care provider. This is important. Contact a health care provider if:  Your pain gets worse.  You have chills or fever.  Your cast or splint is damaged.  The cast has a bad odor or has stains caused by fluids from the wound. Get help right away if:  Your foot is cold, blue, or pale.  You have pain, swelling, redness, or numbness below your cast or splint. This information is not intended to replace advice given to you by your health care provider. Make sure you discuss any questions you have with your health care provider. Document Released: 06/01/2014 Document Revised: 04/11/2016 Document Reviewed: 01/28/2014 Elsevier Interactive Patient Education  2018 [employer]   Ankle Sprain An ankle sprain is a stretch or tear in one of the tough tissues (ligaments) in your ankle. Follow these instructions at home:  Rest your ankle.  Take over-the-counter and prescription medicines only as told by your doctor.  For 2-3 days, keep your ankle higher than the level of your heart (elevated) as much as possible.  If directed, put ice on the area: ? Put ice in a plastic bag. ? Place a towel between your skin and the bag. ? Leave the ice on for 20 minutes, 2-3 times a day.  If you were given a brace: ? Wear it as told. ? Take it off to shower or bathe. ? Try not to move your ankle much, but wiggle your toes from time to time. This helps to prevent swelling.  If you were given an elastic bandage (dressing): ? Take it off when you shower or bathe. ? Try not to move your ankle much, but wiggle your toes from time to time. This helps to prevent swelling. ? Adjust the bandage to make it more comfortable if it  feels too tight. ? Loosen the bandage if you lose feeling in your foot, your foot tingles, or your foot gets cold and blue.  If you have crutches, use them as told by your doctor. Continue to use them until you can walk without feeling pain in your ankle. Contact a doctor if:  Your bruises or swelling are quickly getting worse.  Your pain does not get better after you take medicine. Get help right away if:  You cannot feel your toes or foot.  Your toes or your foot looks blue.  You have very bad pain that gets worse. This information is not intended to replace advice given to you by your health care provider. Make sure you discuss any questions you have with your health care provider. Document Released: 04/22/2008  Document Revised: 04/11/2016 Document Reviewed: 06/06/2015 Elsevier Interactive Patient Education  [employer].

## 2018-10-12 ENCOUNTER — Telehealth: Payer: Self-pay | Admitting: Family Medicine

## 2018-10-12 DIAGNOSIS — S99912A Unspecified injury of left ankle, initial encounter: Secondary | ICD-10-CM

## 2018-10-12 NOTE — Telephone Encounter (Signed)
Patient notified and verbalized understanding. 

## 2018-10-12 NOTE — Telephone Encounter (Signed)
Please inform patient the following information: Her xray is normal. No fracture present or identifiable cause to her discomfort.  Wear the brace, refrain from training/physcial activity and take meds as discussed.  Referral to Sports med completed.  F/U here in 2 if worsening- or 4 weeks if not resolved, only if not established with Sports med by that time.

## 2018-10-14 ENCOUNTER — Ambulatory Visit (INDEPENDENT_AMBULATORY_CARE_PROVIDER_SITE_OTHER): Payer: 59 | Admitting: Family Medicine

## 2018-10-14 ENCOUNTER — Encounter: Payer: Self-pay | Admitting: Family Medicine

## 2018-10-14 VITALS — BP 125/69 | HR 66 | Ht 65.0 in | Wt 151.0 lb

## 2018-10-14 DIAGNOSIS — S93491A Sprain of other ligament of right ankle, initial encounter: Secondary | ICD-10-CM

## 2018-10-14 DIAGNOSIS — M7671 Peroneal tendinitis, right leg: Secondary | ICD-10-CM

## 2018-10-14 DIAGNOSIS — E039 Hypothyroidism, unspecified: Secondary | ICD-10-CM | POA: Insufficient documentation

## 2018-10-14 DIAGNOSIS — J4520 Mild intermittent asthma, uncomplicated: Secondary | ICD-10-CM | POA: Insufficient documentation

## 2018-10-14 MED ORDER — DICLOFENAC SODIUM 1 % TD GEL
2.0000 g | Freq: Four times a day (QID) | TRANSDERMAL | 11 refills | Status: AC
Start: 2018-10-14 — End: ?

## 2018-10-14 NOTE — Patient Instructions (Addendum)
Thank you for coming in today.  Apply the diclofenac gel up to 4x daily as needed for pain.  Use the bands.  Do about 30 reps 2x daily.  Use the ankle brace with activity.  Recheck in about 6 weeks or sooner if needed.   Peroneal Tendinopathy Peroneal tendinopathy is irritation of the tendons that pass behind your ankle (peroneal tendons). These tendons attach muscles in your foot to a bone on the side of your foot and underneath the arch of your foot. This condition can cause your peroneal tendons to get bigger and swell. What are the causes? This condition may be caused by:  Putting stress on your ankle over and over again (overuse injury).  A sudden injury that puts stress on your tendons, such as an ankle sprain.  What increases the risk? This condition is more likely to develop in:  People who have high arches.  Athletes who play sports that involve putting stress on the ankle over and over again. These sports include: ? Running. ? Dancing. ? Soccer. ? Basketball.  What are the signs or symptoms? Symptoms of this condition can start suddenly or develop gradually. Symptoms include:  Pain in the back of the ankle, on the side of the foot, or in the arch of the foot.  Pain that gets worse with activity and better with rest.  Swelling.  Warmth.  Weakness in your foot or ankle.  How is this diagnosed? This condition may be diagnosed based on:  Your symptoms.  Your medical history.  A physical exam.  Imaging tests, such as: ? An X-ray or CT scan to check for bone injury. ? MRI or ultrasound to check for muscle or tendon injury.  During your physical exam, your health care provider may move your foot and ankle and test the strength of your leg muscles. How is this treated? This condition may be treated by:  Keeping your body weight off your ankle for several days.  Returning gradually to full activity gradually.  Putting ice on your ankle to reduce  swelling.  Taking an anti-inflammatory pain medicine (NSAID).  Having medicine injected into your tendon to reduce swelling.  Wearing a removable boot or brace for ankle support.  Doing range-of-motion exercises and strengthening exercises (physical therapy) when pain and swelling improve.  If the condition does not improve with treatment, or if a tendon or muscle is damaged, surgery may be needed. Follow these instructions at home: If you have a boot or brace:  Wear it as told by your health care provider. Remove it only as told by your health care provider.  Loosen it if your toes tingle, become numb, or turn cold and blue.  Do not let it get wet if it is not waterproof.  Keep it clean. Managing pain, stiffness, and swelling  If directed, apply ice to the injured area: ? Put ice in a plastic bag. ? Place a towel between your skin and the bag. ? Leave the ice on for 20 minutes, 2-3 times a day.  Take over-the-counter and prescription medicines only as told by your health care provider.  Raise (elevate) your ankle above the level of your heart when resting if you have swelling. Activity  Do not use your ankle to support (bear) your full body weight until your health care provider says that you can.  Do not do activities that make pain or swelling worse.  Return to your normal activities as told by your health care  provider. General instructions  Keep all follow-up visits as told by your health care provider. This is important. How is this prevented?  Wear supportive footwear that is appropriate for your athletic activity.  Avoid athletic activities that cause swelling or pain in your ankle or foot.  See your health care provider if you have pain or swelling that does not improve after a few days of rest.  Stop training if you develop pain or swelling.  If you start a new athletic activity, start gradually to build up your strength, endurance, and  flexibility. Contact a health care provider if:  Your symptoms get worse.  Your symptoms do not improve in 2-4 weeks.  You develop new, unexplained symptoms. This information is not intended to replace advice given to you by your health care provider. Make sure you discuss any questions you have with your health care provider. Document Released: 11/04/2005 Document Revised: 07/09/2016 Document Reviewed: 09/23/2015 Elsevier Interactive Patient Education  2018 [employer]    Peroneal Tendinopathy Rehab Ask your health care provider which exercises are safe for you. Do exercises exactly as told by your health care provider and adjust them as directed. It is normal to feel mild stretching, pulling, tightness, or discomfort as you do these exercises, but you should stop right away if you feel sudden pain or your pain gets worse.Do not begin these exercises until told by your health care provider. Stretching and range of motion exercises These exercises warm up your muscles and joints and improve the movement and flexibility of your ankle. These exercises also help to relieve pain and stiffness. Exercise A: Gastroc and soleus, standing 1. Stand on the edge of a step on the balls of your feet. The ball of your foot is on the walking surface, right under your toes. 2. Hold onto the railing for balance. 3. Slowly lift your left / right foot, allowing your body weight to press your left / right heel down over the edge of the step. You should feel a stretch in your left / right calf. 4. Hold this position for __________ seconds. Repeat __________ times with your left / right knee straight and __________ times with your left / right knee bent. Complete this stretch __________ times per day. Strengthening exercises These exercises improve the strength and endurance of your foot and ankle. Endurance is the ability to use your muscles for a long time, even after they get tired. Exercise B:  Dorsiflexors  1. Secure a rubber exercise band or tube to an object, like a table leg, that will not move if it is pulled on. 2. Secure the other end of the band around your left / right foot. 3. Sit on the floor, facing the object with your left / right foot extended. The band or tube should be slightly tense when your foot is relaxed. 4. Slowly flex your left / right ankle and toes to bring your foot toward you. 5. Hold this position for __________ seconds. 6. Slowly return your foot to the starting position. Repeat __________ times. Complete this exercise __________ times per day. Exercise C: Evertors 1. Sit on the floor with your legs straight out in front of you. 2. Loop a rubber exercise or band or tube around the ball of your left / right foot. The ball of your foot is on the walking surface, right under your toes. 3. Hold the ends of the band in your hands, or secure the band to a stable object. 4.  Slowly push your foot outward, away from your other leg. 5. Hold this position for __________ seconds. 6. Slowly return your foot to the starting position. Repeat __________ times. Complete this exercise __________ times per day. Exercise D: Standing heel raise ( plantar flexion) 1. Stand with your feet shoulder-width apart with the balls of your feet on a step. The ball of your foot is on the walking surface, right under your toes. 2. Keep your weight spread evenly over the width of your feet while you rise up on your toes. Use a wall or railing to steady yourself, but try not to use it for support. 3. If this exercise is too easy, try these options: ? Shift your weight toward your left / right leg until you feel challenged. ? If told by your health care provider, stand on your left / right leg only. 4. Hold this position for __________ seconds. Repeat __________ times. Complete this exercise __________ times per day. Exercise E: Single leg stand 1. Without shoes, stand near a railing  or in a doorway. You may hold onto the railing or door frame as needed. 2. Stand on your left / right foot. Keep your big toe down on the floor and try to keep your arch lifted. ? Do not roll to the outside of your foot. ? If this exercise is too easy, you can try it with your eyes closed or while standing on a pillow. 3. Hold this position for __________ seconds. Repeat __________ times. Complete this exercise __________ times per day. This information is not intended to replace advice given to you by your health care provider. Make sure you discuss any questions you have with your health care provider. Document Released: 11/04/2005 Document Revised: 07/11/2016 Document Reviewed: 09/23/2015 Elsevier Interactive Patient Education  [employer].

## 2018-10-14 NOTE — Progress Notes (Signed)
Subjective:    I'm seeing this patient as a consultation for:  Kuneff, Renee A, DO   CC: Left ankle injury.  HPI: Patient suffered a left ankle injury about 9 weeks ago while rollerblading.  She thinks she suffered an inversion injury.  In the interim she is been using ice heat brace.  She notes that she is in the process of training for a 5K tough mudders and notes that the pain is interfere with her ability to participate in this.  She denies any radiating pain weakness or numbness fevers or chills.  In by her PCP on November 22 for this injury.  She was evaluated with physical exam and x-ray.  X-ray was largely unremarkable.  She was referred to me for further evaluation and management of this problem.  She notes pain on the lateral aspect of her ankle along the course of the peroneal tendon at the posterior lateral malleolus.  Pain is worse with activity such as running.  She is able to walk without much pain.  She denies any radiating pain weakness or numbness chills.  She has been completing home exercise program as directed by physician.  Past medical history, Surgical history, Family history not pertinant except as noted below, Social history, Allergies, and medications have been entered into the medical record, reviewed, and no changes needed.   Review of Systems: No headache, visual changes, nausea, vomiting, diarrhea, constipation, dizziness, abdominal pain, skin rash, fevers, chills, night sweats, weight loss, swollen lymph nodes, body aches, joint swelling, muscle aches, chest pain, shortness of breath, mood changes, visual or auditory hallucinations.   Objective:    Vitals:   10/14/18 1321  BP: 125/69  Pulse: 66   General: Well Developed, well nourished, and in no acute distress.  Neuro/Psych: Alert and oriented x3, extra-ocular muscles intact, able to move all 4 extremities, sensation grossly intact. Skin: Warm and dry, no rashes noted.  Respiratory: Not using accessory  muscles, speaking in full sentences, trachea midline.  Cardiovascular: Pulses palpable, no extremity edema. Abdomen: Does not appear distended. MSK: Left ankle relatively normal-appearing slightly swollen overlying the region of the lateral malleolus. Normal ankle motion. Tender palpation at the lateral malleolus at the ATFL area as well as the posterior lateral malleolus along the course the peroneal tendon.  Not particularly tender at the insertion onto the proximal fifth metatarsal. Ankle motion is intact. Mild pain with resisted foot and ankle eversion. Mildly lax talar tilt test.  Normal anterior drawer test.  Left foot normal-appearing nontender normal motion.  Pulses cap refill and sensation intact distally.  Contralateral right ankle nontender with normal motion and strength.  Lab and Radiology Results Dg Ankle Complete Left  Result Date: 10/12/2018 CLINICAL DATA:  Left ankle pain after injury 2 months ago. EXAM: LEFT ANKLE COMPLETE - 3+ VIEW COMPARISON:  None. FINDINGS: There is no evidence of fracture, dislocation, or joint effusion. There is no evidence of arthropathy or other focal bone abnormality. Soft tissues are unremarkable. IMPRESSION: Negative. Electronically Signed   By: Lupita Raider, M.D.   On: 10/12/2018 08:31   I personally (independently) visualized and performed the interpretation of the images attached in this note.  Limited musculoskeletal ultrasound of the left lateral ankle reveals normal bony structures.  Intact peroneal tendons.  Slight hypoechoic fluid and slight thickened diameter of peroneal brevis tendon just distal to the posterior lateral malleolus.  Increased vascular activity in this area.  No definitive tear seen. Impression: Peroneal tendinitis without  tear   Impression and Recommendations:    Assessment and Plan: 42 y.o. female with left lateral ankle pain following inversion injury 9 weeks ago.  Likely first-second-degree ankle sprain with  resulting peroneal tendinopathy.  Not fully healed and symptoms are now interfering with quality of life including interfere with exercise.  Plan for referral to physical therapy and home exercise program.  Additionally use diclofenac gel.  If not improving in a few weeks next step would likely be topical nitroglycerin patches.  If failing to improve ultimately MRI would be in the future..  Plan for recheck in about 6 weeks.  Return sooner if needed.   Orders Placed This Encounter  Procedures  . Ambulatory referral to Physical Therapy    Referral Priority:   Routine    Referral Type:   Physical Medicine    Referral Reason:   Specialty Services Required    Requested Specialty:   Physical Therapy    Number of Visits Requested:   1   Meds ordered this encounter  Medications  . diclofenac sodium (VOLTAREN) 1 % GEL    Sig: Apply 2 g topically 4 (four) times daily. To affected joint.    Dispense:  100 g    Refill:  11    Discussed warning signs or symptoms. Please see discharge instructions. Patient expresses understanding.

## 2018-10-21 DIAGNOSIS — S93402A Sprain of unspecified ligament of left ankle, initial encounter: Secondary | ICD-10-CM | POA: Diagnosis not present

## 2018-10-28 DIAGNOSIS — S93402A Sprain of unspecified ligament of left ankle, initial encounter: Secondary | ICD-10-CM | POA: Diagnosis not present

## 2018-11-02 ENCOUNTER — Encounter: Payer: Self-pay | Admitting: Family Medicine

## 2018-11-02 DIAGNOSIS — S93402A Sprain of unspecified ligament of left ankle, initial encounter: Secondary | ICD-10-CM | POA: Diagnosis not present

## 2018-11-05 DIAGNOSIS — M459 Ankylosing spondylitis of unspecified sites in spine: Secondary | ICD-10-CM | POA: Insufficient documentation

## 2018-11-16 DIAGNOSIS — S93402A Sprain of unspecified ligament of left ankle, initial encounter: Secondary | ICD-10-CM | POA: Diagnosis not present

## 2019-01-26 DIAGNOSIS — N3000 Acute cystitis without hematuria: Secondary | ICD-10-CM | POA: Diagnosis not present

## 2019-02-18 DIAGNOSIS — Z01419 Encounter for gynecological examination (general) (routine) without abnormal findings: Secondary | ICD-10-CM | POA: Diagnosis not present

## 2019-02-18 DIAGNOSIS — Z6824 Body mass index (BMI) 24.0-24.9, adult: Secondary | ICD-10-CM | POA: Diagnosis not present
